# Patient Record
Sex: Male | Born: 1995 | Race: Black or African American | Hispanic: No | Marital: Single | Smoking: Never smoker
Health system: Southern US, Community
[De-identification: ages and names within clinical notes are randomized; demographics above are authoritative.]

## PROBLEM LIST (undated history)

## (undated) DIAGNOSIS — E669 Obesity, unspecified: Secondary | ICD-10-CM

## (undated) DIAGNOSIS — I10 Essential (primary) hypertension: Secondary | ICD-10-CM

## (undated) HISTORY — DX: Essential (primary) hypertension: I10

## (undated) HISTORY — PX: NO PAST SURGERIES: SHX2092

## (undated) HISTORY — DX: Obesity, unspecified: E66.9

---
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
## 2019-04-05 ENCOUNTER — Emergency Department (HOSPITAL_COMMUNITY)
Admission: EM | Admit: 2019-04-05 | Discharge: 2019-04-05 | Disposition: A | Discharge status: Home / Self Care | Payer: Self-pay | Attending: Emergency Medicine | Admitting: Emergency Medicine

## 2019-04-05 ENCOUNTER — Other Ambulatory Visit: Payer: Self-pay

## 2019-04-05 ENCOUNTER — Encounter (HOSPITAL_COMMUNITY): Payer: Self-pay

## 2019-04-05 DIAGNOSIS — Y9289 Other specified places as the place of occurrence of the external cause: Secondary | ICD-10-CM | POA: Insufficient documentation

## 2019-04-05 DIAGNOSIS — Y9361 Activity, american tackle football: Secondary | ICD-10-CM | POA: Insufficient documentation

## 2019-04-05 DIAGNOSIS — M545 Low back pain, unspecified: Secondary | ICD-10-CM

## 2019-04-05 DIAGNOSIS — Y999 Unspecified external cause status: Secondary | ICD-10-CM | POA: Insufficient documentation

## 2019-04-05 DIAGNOSIS — X509XXA Other and unspecified overexertion or strenuous movements or postures, initial encounter: Secondary | ICD-10-CM | POA: Insufficient documentation

## 2019-04-05 MED ORDER — CYCLOBENZAPRINE HCL 5 MG PO TABS: 5.0000 mg | ORAL_TABLET | Freq: Two times a day (BID) | ORAL | 0 refills | Status: DC | PRN

## 2019-04-05 MED ORDER — NAPROXEN 500 MG PO TABS: 500.0000 mg | ORAL_TABLET | Freq: Two times a day (BID) | ORAL | 0 refills | Status: DC | PRN

## 2019-04-05 NOTE — ED Triage Notes (Addendum)
Patient c/o left lower back pain radiating down left buttock.  Pain started last Monday.   Recurrent. Started last fall during football.   3/10 pain back pain.   When patient get up out of bed it is 10/10 pain.    A/OX4 Ambulatory in triage.

## 2019-04-05 NOTE — ED Notes (Signed)
An After Visit Summary was printed and given to the patient. Discharge instructions given and no further questions at this time, prescriptions and work notes given to pt.

## 2019-04-05 NOTE — Discharge Instructions (Signed)
It was my pleasure taking care of you today!   Naproxen as needed for pain. Flexeril is your muscle relaxer.   Rest for the next couple of days. If you are feeling better on 7/31 you can return to practice. If you are still having discomfort, sit out of practice the full week.   If you are still having any discomfort after 1 week, please see your primary care doctor or you can call the orthopedist listed for follow up appointment.    COLD THERAPY DIRECTIONS:  Ice or gel packs can be used to reduce both pain and swelling. Ice is the most helpful within the first 24 to 48 hours after an injury or flareup from overusing a muscle or joint.  Ice is effective, has very few side effects, and is safe for most people to use.   Return to the ED for worsening back pain, fever, weakness or numbness of either leg, or if you develop either (1) an inability to urinate or have bowel movements, or (2) loss of your ability to control your bathroom functions (if you start having "accidents"), or if you develop other new symptoms that concern you.

## 2019-04-05 NOTE — ED Provider Notes (Signed)
[facility]-EMERGENCY DEPT Provider Note   CSN: [id number] Arrival date & time: 04/05/19  1435     History   Chief Complaint Chief Complaint  Patient presents with  . Back Pain    HPI Ethan Vasquez is a 23 y.o. male.     The history is provided by the patient and medical records. No language interpreter was used.  Back Pain  Ethan Vasquez is a 23 y.o. male  who presents to the Emergency Department complaining of left-sided low back pain which radiates to the left buttocks x 1 week.  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  They were hopping up hills and going up hills sideways.  He states that he frequently does exercises and training on hills, but never this specific type.  The next morning, he had discomfort to the left low back which is worse with certain movements and stretches.  His mom had a muscle relaxer at home which he took with some improvement in his symptoms.  He denies any radiation further down the leg, just radiates to the buttocks itself.  Patient denies upper back or neck pain. No fever, saddle anesthesia, weakness, numbness, urinary complaints including retention/incontinence. No history of cancer, IVDU, or recent spinal procedures.   History reviewed. No pertinent past medical history.  There are no active problems to display for this patient.   History reviewed. No pertinent surgical history.      Home Medications    Prior to Admission medications   Medication Sig Start Date End Date Taking? Authorizing Provider  cyclobenzaprine (FLEXERIL) 5 MG tablet Take 1 tablet (5 mg total) by mouth 2 (two) times daily as needed for muscle spasms. 04/05/19   Kelsye Loomer, Chase PicketJaime Pilcher, PA-C  HYDROcodone-acetaminophen (NORCO/VICODIN) 5-325 MG per tablet Take 1-2 tablets by mouth every 6 (six) hours as needed. 01/21/14   Blake DivineWofford, John, MD  mupirocin cream (BACTROBAN) 2 % Apply 1 application topically 3 (three)  times daily. 10/25/13   Renne CriglerGeiple, Joshua, PA-C  naproxen (NAPROSYN) 500 MG tablet Take 1 tablet (500 mg total) by mouth 2 (two) times daily as needed. 04/05/19   Janele Lague, Chase PicketJaime Pilcher, PA-C    Family History No family history on file.  Social History Social History   Tobacco Use  . Smoking status: Never Smoker  . Smokeless tobacco: Never Used  Substance Use Topics  . Alcohol use: No  . Drug use: Not on file     Allergies   Pollen extract   Review of Systems Review of Systems  Musculoskeletal: Positive for back pain.  All other systems reviewed and are negative.    Physical Exam Updated Vital Signs BP (!) 161/98 (BP Location: Left Arm)   Pulse 78   Temp 98.8 F (37.1 C) (Oral)   Resp 18   Ht 6\' 3"  (1.905 m)   Wt (!) 158.8 kg   SpO2 99%   BMI 43.75 kg/m   Physical Exam Vitals signs and nursing note reviewed.  Constitutional:      Appearance: He is well-developed.  Neck:     Comments: Full ROM without pain No midline tenderness No tenderness of paraspinal musculature Cardiovascular:     Rate and Rhythm: Normal rate and regular rhythm.     Heart sounds: Normal heart sounds. No murmur. No friction rub. No gallop.   Pulmonary:     Effort: Pulmonary effort is normal. No respiratory distress.     Breath sounds: Normal breath  sounds. No wheezing or rales.  Abdominal:     General: Bowel sounds are normal. There is no distension.     Palpations: Abdomen is soft.     Comments: No abdominal tenderness.  Musculoskeletal:     Comments: Patient is able to ambulate without difficulty.  No noted deformities or signs of inflammation. No overlying skin changes. No midline tenderness; mild tenderness to palpation of lumbar paraspinal musculature and left buttocks. Straight leg raises are negative bilaterally for radicular symptoms. 5/5 muscle strength of bilateral LE's  Skin:    General: Skin is warm and dry.     Findings: No erythema or rash.  Neurological:     Mental  Status: He is alert and oriented to person, place, and time.     Deep Tendon Reflexes: Reflexes are normal and symmetric.     Comments: Bilateral lower extremities neurovascularly intact.      ED Treatments / Results  Labs (all labs ordered are listed, but only abnormal results are displayed) Labs Reviewed - No data to display  EKG None  Radiology No results found.  Procedures Procedures (including critical care time)  Medications Ordered in ED Medications - No data to display   Initial Impression / Assessment and Plan / ED Course  I have reviewed the triage vital signs and the nursing notes.  Pertinent labs & imaging results that were available during my care of the patient were reviewed by me and considered in my medical decision making (see chart for details).       Ethan Vasquez is a 23 y.o. male who presents to ED for left low back pain x 1 week.  Patient demonstrates no lower extremity weakness, saddle anesthesia, bowel or bladder incontinence or neuro deficits. No concern for cauda equina. No fevers or other infectious symptoms to suggest that the patient's back pain is due to an infection. Lower extremities are neurovascularly intact and patient is ambulating without difficulty. I have reviewed return precautions, including the development of any of these signs or symptoms and the patient has voiced understanding. I reviewed symptomatic home care instructions and PCP/ortho follow-up if symptoms do not improve. RX for Naproxen/flexeril. Patient voiced understanding and agreement with plan. All questions answered.  Final Clinical Impressions(s) / ED Diagnoses   Final diagnoses:  Acute left-sided low back pain without sciatica    ED Discharge Orders         Ordered    cyclobenzaprine (FLEXERIL) 5 MG tablet  2 times daily PRN     04/05/19 1604    naproxen (NAPROSYN) 500 MG tablet  2 times daily PRN     04/05/19 1604           Sequoia Witz, Ozella Almond, PA-C  04/05/19 1616    Fredia Sorrow, MD 04/07/19 1556

## 2020-01-18 ENCOUNTER — Other Ambulatory Visit: Payer: Self-pay

## 2020-01-18 ENCOUNTER — Ambulatory Visit: Payer: Self-pay | Attending: Nurse Practitioner | Admitting: Nurse Practitioner

## 2020-01-18 ENCOUNTER — Encounter: Payer: Self-pay | Admitting: Nurse Practitioner

## 2020-01-18 VITALS — Ht 75.0 in | Wt 350.0 lb

## 2020-01-18 DIAGNOSIS — Z7689 Persons encountering health services in other specified circumstances: Secondary | ICD-10-CM

## 2020-01-18 DIAGNOSIS — Z8679 Personal history of other diseases of the circulatory system: Secondary | ICD-10-CM

## 2020-01-18 NOTE — Progress Notes (Signed)
Virtual Visit via Telephone Note Due to national recommendations of social distancing due to COVID 19, telehealth visit is felt to be most appropriate for this patient at this time.  I discussed the limitations, risks, security and privacy concerns of performing an evaluation and management service by telephone and the availability of in person appointments. I also discussed with the patient that there may be a patient responsible charge related to this service. The patient expressed understanding and agreed to proceed.    I connected with Ethan Vasquez on 01/18/20  at   1:50 PM EDT  EDT by telephone and verified that I am speaking with the correct person using two identifiers.   Consent I discussed the limitations, risks, security and privacy concerns of performing an evaluation and management service by telephone and the availability of in person appointments. I also discussed with the patient that there may be a patient responsible charge related to this service. The patient expressed understanding and agreed to proceed.   Location of Patient: Private Residence    Location of Provider: [facility]    Persons participating in Telemedicine visit: Ethan Vasquez YY Ethan Vasquez Ethan Vasquez    History of Present Illness: Telemedicine visit for: Establish Care  History of Obesity and HTN.  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 Needs a physical for school. Has a history of HTN. Referred to a pediatric nephrologist in 2015. Mother states he lost weight, blood pressure came down and he was taken off blood pressure medication (lisinopril). At his last office visit with nephrology his BMI was 36.5. BP was 118/70 and nephrology signed off at that time.  He has a device at home however has not been monitoring his blood pressure.  BP Readings from Last 3 Encounters:  04/05/19 (!) 161/98  01/21/14 125/62  10/25/13  145/60      Past Medical History:  Diagnosis Date  . Hypertension   . Obesity     Past Surgical History:  Procedure Laterality Date  . NO PAST SURGERIES      Family History  Problem Relation Age of Onset  . Hypertension Mother   . Thyroid disease Mother   . Atrial fibrillation Father   . Hypertension Father     Social History   Socioeconomic History  . Marital status: Single    Spouse name: Not on file  . Number of children: Not on file  . Years of education: Not on file  . Highest education level: Not on file  Occupational History  . Not on file  Tobacco Use  . Smoking status: Never Smoker  . Smokeless tobacco: Never Used  Substance and Sexual Activity  . Alcohol use: No  . Drug use: Not Currently  . Sexual activity: Not on file  Other Topics Concern  . Not on file  Social History Narrative  . Not on file   Social Determinants of Health   Financial Resource Strain:   . Difficulty of Paying Living Expenses:   Food Insecurity:   . Worried About Programme researcher, broadcasting/film/video in the Last Year:   . Barista in the Last Year:   Transportation Needs:   . Freight forwarder (Medical):   Marland Kitchen Lack of Transportation (Non-Medical):   Physical Activity:   . Days of Exercise per Week:   . Minutes of Exercise per Session:   Stress:   . Feeling of Stress :   Social  Connections:   . Frequency of Communication with Friends and Family:   . Frequency of Social Gatherings with Friends and Family:   . Attends Religious Services:   . Active Member of Clubs or Organizations:   . Attends Archivist Meetings:   Marland Kitchen Marital Status:      Observations/Objective: Awake, alert and oriented x 3   Review of Systems  Constitutional: Negative for fever, malaise/fatigue and weight loss.  HENT: Negative.  Negative for nosebleeds.   Eyes: Negative.  Negative for blurred vision, double vision and photophobia.  Respiratory: Negative.  Negative for cough and shortness of  breath.   Cardiovascular: Negative.  Negative for chest pain, palpitations and leg swelling.  Gastrointestinal: Negative.  Negative for heartburn, nausea and vomiting.  Musculoskeletal: Negative.  Negative for myalgias.  Neurological: Negative.  Negative for dizziness, focal weakness, seizures and headaches.  Psychiatric/Behavioral: Negative.  Negative for suicidal ideas.    Assessment and Plan: Ethan was seen today for new patient (initial visit).  Diagnoses and all orders for this visit:  Encounter to establish care  History of essential hypertension Remember to bring in your blood pressure log with you for your follow up appointment.  DASH/Mediterranean Diets are healthier choices for HTN.     Follow Up Instructions Return in about 3 weeks (around 02/08/2020) for FASTING labs and Physical.     I discussed the assessment and treatment plan with the patient. The patient was provided an opportunity to ask questions and all were answered. The patient agreed with the plan and demonstrated an understanding of the instructions.   The patient was advised to call back or seek an in-person evaluation if the symptoms worsen or if the condition fails to improve as anticipated.  I provided 13 minutes of non-face-to-face time during this encounter including median intraservice time, reviewing previous notes, labs, imaging, medications and explaining diagnosis and management.  Ethan Vasquez, Vasquez

## 2020-02-03 ENCOUNTER — Ambulatory Visit: Payer: Self-pay | Attending: Nurse Practitioner | Admitting: Nurse Practitioner

## 2020-02-03 ENCOUNTER — Encounter: Payer: Self-pay | Admitting: Nurse Practitioner

## 2020-02-03 ENCOUNTER — Other Ambulatory Visit: Payer: Self-pay

## 2020-02-03 VITALS — BP 129/75 | HR 88 | Temp 97.7°F | Ht 75.0 in | Wt >= 6400 oz

## 2020-02-03 DIAGNOSIS — E669 Obesity, unspecified: Secondary | ICD-10-CM

## 2020-02-03 DIAGNOSIS — Z0000 Encounter for general adult medical examination without abnormal findings: Secondary | ICD-10-CM

## 2020-02-03 DIAGNOSIS — Z130 Encounter for screening for diseases of the blood and blood-forming organs and certain disorders involving the immune mechanism: Secondary | ICD-10-CM

## 2020-02-03 DIAGNOSIS — Z8679 Personal history of other diseases of the circulatory system: Secondary | ICD-10-CM

## 2020-02-03 NOTE — Progress Notes (Signed)
Assessment & Plan:  Ethan Vasquez was seen today for annual exam.  Diagnoses and all orders for this visit:  History of essential hypertension -     CMP14+EGFR -     Urinalysis, Complete  Encounter for annual physical exam Recommend eye exam  Screening for deficiency anemia -     CBC  Obesity (BMI 30-39.9) -     TSH    Patient has been counseled on age-appropriate routine health concerns for screening and prevention. These are reviewed and up-to-date. Referrals have been placed accordingly. Immunizations are up-to-date or declined.    Subjective:   Chief Complaint  Patient presents with  . Annual Exam    Pt. is here for a physical.   HPI Ethan Vasquez 24 y.o. male presents to office today for well exam. History of hypertension however blood pressure is normal today.   Review of Systems  Constitutional: Negative for fever, malaise/fatigue and weight loss.  HENT: Negative.  Negative for nosebleeds.   Eyes: Negative.  Negative for blurred vision, double vision and photophobia.  Respiratory: Negative.  Negative for cough and shortness of breath.   Cardiovascular: Negative.  Negative for chest pain, palpitations and leg swelling.  Gastrointestinal: Negative.  Negative for heartburn, nausea and vomiting.  Genitourinary: Negative.   Musculoskeletal: Negative.  Negative for myalgias.  Skin: Negative.   Neurological: Negative.  Negative for dizziness, focal weakness, seizures and headaches.  Endo/Heme/Allergies: Negative.   Psychiatric/Behavioral: Negative.  Negative for suicidal ideas.    Past Medical History:  Diagnosis Date  . Hypertension   . Obesity     Past Surgical History:  Procedure Laterality Date  . NO PAST SURGERIES      Family History  Problem Relation Age of Onset  . Hypertension Mother   . Thyroid disease Mother   . Atrial fibrillation Father   . Hypertension Father     Social History Reviewed with no changes to be made today.    Outpatient Medications Prior to Visit  Medication Sig Dispense Refill  . mupirocin cream (BACTROBAN) 2 % Apply 1 application topically 3 (three) times daily. (Patient not taking: Reported on 01/18/2020) 15 g 0  . naproxen (NAPROSYN) 500 MG tablet Take 1 tablet (500 mg total) by mouth 2 (two) times daily as needed. (Patient not taking: Reported on 01/18/2020) 30 tablet 0   No facility-administered medications prior to visit.    Allergies  Allergen Reactions  . Pollen Extract Hives and Rash    face       Objective:    BP 129/75 (BP Location: Left Arm, Patient Position: Sitting, Cuff Size: Large)   Pulse 88   Temp 97.7 F (36.5 C) (Temporal)   Ht _0  (1.905 m)   Wt (!) 422 lb (191.4 kg)   SpO2 98%   BMI 52.75 kg/m  Wt Readings from Last 3 Encounters:  02/03/20 (!) 422 lb (191.4 kg)  01/18/20 (!) 350 lb (158.8 kg)  04/05/19 (!) 350 lb (158.8 kg)    Physical Exam Constitutional:      Appearance: He is well-developed.  HENT:     Head: Normocephalic and atraumatic.     Right Ear: Hearing, tympanic membrane, ear canal and external ear normal.     Left Ear: Hearing, tympanic membrane, ear canal and external ear normal.     Nose: Nose normal. No mucosal edema or rhinorrhea.     Mouth/Throat:     Pharynx: Uvula midline.     Tonsils: No tonsillar  exudate. 1+ on the right. 1+ on the left.  Eyes:     General: Lids are normal. Gaze aligned appropriately. Visual field deficit present. No scleral icterus.    Extraocular Movements: Extraocular movements intact.     Right eye: Normal extraocular motion and no nystagmus.     Left eye: Normal extraocular motion and no nystagmus.     Conjunctiva/sclera: Conjunctivae normal.     Pupils: Pupils are equal, round, and reactive to light.  Neck:     Thyroid: No thyromegaly.     Trachea: No tracheal deviation.  Cardiovascular:     Rate and Rhythm: Normal rate and regular rhythm.     Heart sounds: Normal heart sounds. No murmur. No  friction rub. No gallop.   Pulmonary:     Effort: Pulmonary effort is normal. No respiratory distress.     Breath sounds: Normal breath sounds. No wheezing or rales.  Chest:     Chest wall: No mass or tenderness.     Breasts:        Right: No inverted nipple, mass, nipple discharge, skin change or tenderness.        Left: No inverted nipple, mass, nipple discharge, skin change or tenderness.  Abdominal:     General: Bowel sounds are normal. There is no distension.     Palpations: Abdomen is soft. There is no mass.     Tenderness: There is no abdominal tenderness. There is no guarding or rebound.  Musculoskeletal:        General: No tenderness or deformity. Normal range of motion.     Cervical back: Normal range of motion and neck supple.  Lymphadenopathy:     Cervical: No cervical adenopathy.  Skin:    General: Skin is warm and dry.     Capillary Refill: Capillary refill takes less than 2 seconds.     Findings: No erythema.  Neurological:     Mental Status: He is alert and oriented to person, place, and time.     Cranial Nerves: No cranial nerve deficit.     Motor: No abnormal muscle tone.     Coordination: Coordination normal.     Deep Tendon Reflexes: Reflexes normal.  Psychiatric:        Behavior: Behavior normal.        Thought Content: Thought content normal.        Judgment: Judgment normal.          Patient has been counseled extensively about nutrition and exercise as well as the importance of adherence with medications and regular follow-up. The patient was given clear instructions to go to ER or return to medical center if symptoms don't improve, worsen or new problems develop. The patient verbalized understanding.   Follow-up: Return if symptoms worsen or fail to improve.   [name], FNP-BC [facility] [place], [place]   02/03/2020, 3:41 PM

## 2020-02-04 LAB — CBC
Hematocrit: 44.2 % (ref 37.5–51.0)
Hemoglobin: 14.4 g/dL (ref 13.0–17.7)
MCH: 28.2 pg (ref 26.6–33.0)
MCHC: 32.6 g/dL (ref 31.5–35.7)
MCV: 87 fL (ref 79–97)
Platelets: 239 10*3/uL (ref 150–450)
RBC: 5.11 x10E6/uL (ref 4.14–5.80)
RDW: 14.2 % (ref 11.6–15.4)
WBC: 5.6 10*3/uL (ref 3.4–10.8)

## 2020-02-04 LAB — MICROSCOPIC EXAMINATION
Bacteria, UA: NONE SEEN
Casts: NONE SEEN /lpf

## 2020-02-04 LAB — CMP14+EGFR
ALT: 29 IU/L (ref 0–44)
AST: 35 IU/L (ref 0–40)
Albumin/Globulin Ratio: 1.4 (ref 1.2–2.2)
Albumin: 4.4 g/dL (ref 4.1–5.2)
Alkaline Phosphatase: 90 IU/L (ref 48–121)
BUN/Creatinine Ratio: 12 (ref 9–20)
BUN: 13 mg/dL (ref 6–20)
Bilirubin Total: 0.4 mg/dL (ref 0.0–1.2)
CO2: 23 mmol/L (ref 20–29)
Calcium: 9.4 mg/dL (ref 8.7–10.2)
Chloride: 105 mmol/L (ref 96–106)
Creatinine, Ser: 1.1 mg/dL (ref 0.76–1.27)
GFR calc Af Amer: 110 mL/min/{1.73_m2} (ref >59)
GFR calc non Af Amer: 95 mL/min/{1.73_m2} (ref >59)
Globulin, Total: 3.1 g/dL (ref 1.5–4.5)
Glucose: 82 mg/dL (ref 65–99)
Potassium: 4.2 mmol/L (ref 3.5–5.2)
Sodium: 143 mmol/L (ref 134–144)
Total Protein: 7.5 g/dL (ref 6.0–8.5)

## 2020-02-04 LAB — URINALYSIS, COMPLETE
Bilirubin, UA: NEGATIVE
Glucose, UA: NEGATIVE
Ketones, UA: NEGATIVE
Leukocytes,UA: NEGATIVE
Nitrite, UA: NEGATIVE
RBC, UA: NEGATIVE
Specific Gravity, UA: 1.029 (ref 1.005–1.030)
Urobilinogen, Ur: 1 mg/dL (ref 0.2–1.0)
pH, UA: 7 (ref 5.0–7.5)

## 2020-02-04 LAB — TSH: TSH: 2.68 u[IU]/mL (ref 0.450–4.500)

## 2021-04-05 ENCOUNTER — Ambulatory Visit: Payer: Self-pay | Attending: Critical Care Medicine

## 2021-04-05 DIAGNOSIS — Z20822 Contact with and (suspected) exposure to covid-19: Secondary | ICD-10-CM | POA: Diagnosis not present

## 2021-04-05 NOTE — Addendum Note (Signed)
Addended by: Sherri Rad on: 04/05/2021 01:16 PM   Modules accepted: Orders

## 2021-04-06 LAB — SARS-COV-2, NAA 2 DAY TAT

## 2021-04-06 LAB — NOVEL CORONAVIRUS, NAA: SARS-CoV-2, NAA: NOT DETECTED

## 2021-04-11 DIAGNOSIS — Z20822 Contact with and (suspected) exposure to covid-19: Secondary | ICD-10-CM | POA: Diagnosis not present

## 2021-07-07 DIAGNOSIS — S4352XA Sprain of left acromioclavicular joint, initial encounter: Secondary | ICD-10-CM | POA: Diagnosis not present

## 2021-07-08 DIAGNOSIS — S4352XD Sprain of left acromioclavicular joint, subsequent encounter: Secondary | ICD-10-CM | POA: Diagnosis not present

## 2021-07-09 DIAGNOSIS — S4352XD Sprain of left acromioclavicular joint, subsequent encounter: Secondary | ICD-10-CM | POA: Diagnosis not present

## 2021-07-10 DIAGNOSIS — S4352XD Sprain of left acromioclavicular joint, subsequent encounter: Secondary | ICD-10-CM | POA: Diagnosis not present

## 2021-07-11 DIAGNOSIS — S4352XD Sprain of left acromioclavicular joint, subsequent encounter: Secondary | ICD-10-CM | POA: Diagnosis not present

## 2021-07-12 DIAGNOSIS — S4352XD Sprain of left acromioclavicular joint, subsequent encounter: Secondary | ICD-10-CM | POA: Diagnosis not present

## 2021-10-25 ENCOUNTER — Encounter: Payer: 59 | Admitting: Nurse Practitioner

## 2023-06-17 ENCOUNTER — Ambulatory Visit: Payer: BC Managed Care – PPO | Attending: Physician Assistant | Admitting: Physician Assistant

## 2023-06-17 ENCOUNTER — Encounter: Payer: Self-pay | Admitting: Physician Assistant

## 2023-06-17 ENCOUNTER — Other Ambulatory Visit: Payer: Self-pay

## 2023-06-17 VITALS — BP 142/82 | HR 97 | Wt >= 6400 oz

## 2023-06-17 DIAGNOSIS — R030 Elevated blood-pressure reading, without diagnosis of hypertension: Secondary | ICD-10-CM | POA: Insufficient documentation

## 2023-06-17 DIAGNOSIS — M461 Sacroiliitis, not elsewhere classified: Secondary | ICD-10-CM

## 2023-06-17 MED ORDER — METHOCARBAMOL 500 MG PO TABS
1000.0000 mg | ORAL_TABLET | Freq: Three times a day (TID) | ORAL | 0 refills | Status: AC | PRN
  Filled 2023-06-17: qty 90, 15d supply, fill #0

## 2023-06-17 MED ORDER — DICLOFENAC SODIUM 75 MG PO TBEC
75.0000 mg | DELAYED_RELEASE_TABLET | Freq: Two times a day (BID) | ORAL | 0 refills | Status: AC
  Filled 2023-06-17: qty 60, 30d supply, fill #0

## 2023-06-17 NOTE — Progress Notes (Signed)
Patient ID: Ethan Vasquez, male   DOB: 1995-12-24, 27 y.o.   MRN: [id number]   Ethan Vasquez, is a 27 y.o. male  [id number]  [id number]  DOB - 11-11-1995  Chief Complaint  Patient presents with   Back Pain       Subjective:   Ethan Vasquez is a 27 y.o. male here today for pain in R SI joint.  He started feeling pain when he was doing squats at the gym on Monday.  He has been having pain since and has not been to work.  He is a Emergency planning/management officer.  No radiating pain or paresthesias.  He has not taken anything for it.  Walking without assistance.  No problems moving bowels or bladder.    He does have a BP cuff at home.  His mom is with him today.  He declines flu shot    Problem  Elevated Bp Without Diagnosis of Hypertension    ALLERGIES: Allergies  Allergen Reactions   Pollen Extract Hives and Rash    face    PAST MEDICAL HISTORY: Past Medical History:  Diagnosis Date   Hypertension    Obesity     MEDICATIONS AT HOME: Prior to Admission medications   Medication Sig Start Date End Date Taking? Authorizing Provider  diclofenac (VOLTAREN) 75 MG EC tablet Take 1 tablet (75 mg total) by mouth 2 (two) times daily. X 1 week then prn pain 06/17/23  Yes Cyndia Degraff M, PA-C  methocarbamol (ROBAXIN) 500 MG tablet Take 2 tablets (1,000 mg total) by mouth every 8 (eight) hours as needed. X 7 days then prn(only take at night if makes you drowsy) 06/17/23  Yes Raheen Capili, Marzella Schlein, PA-C    ROS: Neg HEENT Neg resp Neg cardiac Neg GI Neg GU Neg MS Neg psych Neg neuro  Objective:   Vitals:   06/17/23 0843 06/17/23 0844  BP: (!) 158/100 (!) 142/82  Pulse: 97   SpO2: 99%   Weight: (!) 424 lb (192.3 kg)    Exam General appearance : Awake, alert, not in any distress. Speech Clear. Not toxic looking; morbidly obese HEENT: Atraumatic and Normocephalic Neck: Supple, no JVD. No cervical lymphadenopathy.  Chest: Good air entry bilaterally, CTAB.  No  rales/rhonchi/wheezing CVS: S1 S2 regular, no murmurs.  Back-ambulates without difficulty or assistance.  No spiny TTP.  +TTP in SI joint.   Extremities: B/L Lower Ext shows no edema, both legs are warm to touch Neurology: Awake alert, and oriented X 3, CN II-XII intact, Non focal Skin: No Rash  Data Review No results found for: "HGBA1C"  Assessment & Plan   1. Elevated BP without diagnosis of hypertension Blood pressure goal is <130/85.  Check blood pressure daily and record.  Check BP after sitting still and quiet for 5 mins.   2. Sacroiliac inflammation (HCC) No red flags - diclofenac (VOLTAREN) 75 MG EC tablet; Take 1 tablet (75 mg total) by mouth 2 (two) times daily. X 1 week then prn pain  Dispense: 60 tablet; Refill: 0 - methocarbamol (ROBAXIN) 500 MG tablet; Take 2 tablets (1,000 mg total) by mouth every 8 (eight) hours as needed. X 7 days then prn(only take at night if makes you drowsy)  Dispense: 90 tablet; Refill: 0    Return in about 6 weeks (around 07/29/2023) for PCP for chronic conditions recheck bp and blood work.  .  The patient was given clear instructions to go to ER or return to medical center if symptoms don't improve,  worsen or new problems develop. The patient verbalized understanding. The patient was told to call to get lab results if they haven't heard anything in the next week.      Georgian Co, PA-C [facility] [place], [place] [phone]   06/17/2023, 9:05 AM

## 2023-06-17 NOTE — Patient Instructions (Addendum)
Blood pressure goal is <130/85.  Check blood pressure daily and record.  Check BP after sitting still and quiet for 5 mins.    Sacroiliac Joint Dysfunction  Sacroiliac joint dysfunction is a condition that causes inflammation on one or both sides of the sacroiliac (SI) joint. The SI joint is the joint between two bones of the pelvis called the sacrum and the ilium. The sacrum is the bone at the base of the spine. The ilium is the large bone that forms the hip. This condition causes deep aching or burning pain in the low back. In some cases, the pain may also spread into one or both buttocks, hips, or thighs. What are the causes? This condition may be caused by: Pregnancy. During pregnancy, extra stress is put on the SI joints because the pelvis widens. Injury, such as: Injuries from car crashes. Sports-related injuries. Work-related injuries. Having one leg that is shorter than the other. Conditions that affect the joints, such as: Rheumatoid arthritis. Gout. Psoriatic arthritis. Joint infection (septic arthritis). Sometimes, the cause of SI joint dysfunction is not known. What are the signs or symptoms? Symptoms of this condition include: Aching or burning pain in the lower back. The pain may also spread to other areas, such as: Buttocks. Groin. Thighs. Muscle spasms in or around the painful areas. Increased pain when standing, walking, running, stair climbing, bending, or lifting. How is this diagnosed? This condition is diagnosed with a physical exam and your medical history. During the exam, the health care provider may move one or both of your legs to different positions to check for pain. Various tests may be done to confirm the diagnosis, including: Imaging tests to look for other causes of pain. These may include: MRI. CT scan. Bone scan. Diagnostic injection. A numbing medicine is injected into the SI joint using a needle. If your pain is temporarily improved or stopped  after the injection, this can indicate that SI joint dysfunction is the problem. How is this treated? Treatment depends on the cause and severity of your condition. Treatment options can be noninvasive and may include: Ice or heat applied to the lower back area after an injury. This may help reduce pain and muscle spasms. Medicines to relieve pain or inflammation or to relax the muscles. Wearing a back brace (sacroiliac brace) to help support the joint while your back is healing. Physical therapy to increase muscle strength around the joint and flexibility at the joint. This may also involve learning proper body positions and ways of moving to relieve stress on the joint. Direct manipulation of the SI joint. Use of a device that provides electrical stimulation to help reduce pain at the joint. Other treatments may include: Injections of steroid medicine into the joint to reduce pain and swelling. Radiofrequency ablation. This treatment uses heat to burn away nerves that are carrying pain messages from the joint. Surgery to put in screws and plates that limit or prevent joint motion. This is rare. Follow these instructions at home: Medicines Take over-the-counter and prescription medicines only as told by your health care provider. Ask your health care provider if the medicine prescribed to you: Requires you to avoid driving or using machinery. Can cause constipation. You may need to take these actions to prevent or treat constipation: Drink enough fluid to keep your urine pale yellow. Take over-the-counter or prescription medicines. Eat foods that are high in fiber, such as beans, whole grains, and fresh fruits and vegetables. Limit foods that are high  in fat and processed sugars, such as fried or sweet foods. If you have a brace: Wear the brace as told by your health care provider. Remove it only as told by your health care provider. Keep the brace clean. If the brace is not  waterproof: Do not let it get wet. Cover it with a watertight covering when you take a bath or a shower. Managing pain, stiffness, and swelling     Icing can help with pain and swelling. Heat may help with muscle tension or spasms. Ask your health care provider if you should use ice or heat. If directed, put ice on the affected area: If you have a removable brace, remove it as told by your health care provider. Put ice in a plastic bag. Place a towel between your skin and the bag. Leave the ice on for 20 minutes, 2-3 times a day. Remove the ice if your skin turns bright red. This is very important. If you cannot feel pain, heat, or cold, you have a greater risk of damage to the area. If directed, apply heat to the affected area as often as told by your health care provider. Use the heat source that your health care provider recommends, such as a moist heat pack or a heating pad. Place a towel between your skin and the heat source. Leave the heat on for 20-30 minutes. Remove the heat if your skin turns bright red. This is especially important if you are unable to feel pain, heat, or cold. You may have a greater risk of getting burned. General instructions Rest as needed. Return to your normal activities as told by your health care provider. Ask your health care provider what activities are safe for you. Do exercises as told by your health care provider or physical therapist. Keep all follow-up visits. This is important. Contact a health care provider if: Your pain is not controlled with medicine. You have a fever. Your pain is getting worse. Get help right away if: You have weakness, numbness, or tingling in your legs or feet. You lose control of your bladder or bowels. Summary Sacroiliac (SI) joint dysfunction is a condition that causes inflammation on one or both sides of the SI joint. This condition causes deep aching or burning pain in the low back. In some cases, the pain may also  spread into one or both buttocks, hips, or thighs. Treatment depends on the cause and severity of your condition. It may include medicines to reduce pain and swelling or to relax muscles. This information is not intended to replace advice given to you by your health care provider. Make sure you discuss any questions you have with your health care provider. Document Revised: 01/05/2020 Document Reviewed: 01/05/2020 Elsevier Patient Education  2024 [employer].

## 2023-07-29 ENCOUNTER — Ambulatory Visit: Payer: Medicaid Other | Admitting: Nurse Practitioner
